# Patient Record
Sex: Male | Born: 1945 | Race: Black or African American | Hispanic: No | Marital: Single | Smoking: Former smoker
Health system: Southern US, Community
[De-identification: ages and names within clinical notes are randomized; demographics above are authoritative.]

## PROBLEM LIST (undated history)

## (undated) DIAGNOSIS — G629 Polyneuropathy, unspecified: Secondary | ICD-10-CM

## (undated) DIAGNOSIS — C189 Malignant neoplasm of colon, unspecified: Secondary | ICD-10-CM

## (undated) DIAGNOSIS — I10 Essential (primary) hypertension: Secondary | ICD-10-CM

## (undated) DIAGNOSIS — C61 Malignant neoplasm of prostate: Secondary | ICD-10-CM

## (undated) DIAGNOSIS — E669 Obesity, unspecified: Secondary | ICD-10-CM

## (undated) DIAGNOSIS — I4891 Unspecified atrial fibrillation: Secondary | ICD-10-CM

## (undated) DIAGNOSIS — C801 Malignant (primary) neoplasm, unspecified: Secondary | ICD-10-CM

## (undated) HISTORY — PX: PROSTATE SURGERY: SHX751

## (undated) HISTORY — PX: COLON SURGERY: SHX602

## (undated) HISTORY — PX: FETAL SURGERY FOR CONGENITAL HERNIA: SHX1618

---

## 2020-04-19 ENCOUNTER — Emergency Department (HOSPITAL_BASED_OUTPATIENT_CLINIC_OR_DEPARTMENT_OTHER)
Admission: EM | Admit: 2020-04-19 | Discharge: 2020-04-19 | Disposition: A | Discharge status: Home / Self Care | Payer: 59 | Attending: Emergency Medicine | Admitting: Emergency Medicine

## 2020-04-19 ENCOUNTER — Other Ambulatory Visit: Payer: Self-pay

## 2020-04-19 ENCOUNTER — Emergency Department (HOSPITAL_BASED_OUTPATIENT_CLINIC_OR_DEPARTMENT_OTHER): Payer: 59

## 2020-04-19 ENCOUNTER — Encounter (HOSPITAL_BASED_OUTPATIENT_CLINIC_OR_DEPARTMENT_OTHER): Payer: Self-pay | Admitting: Emergency Medicine

## 2020-04-19 DIAGNOSIS — Z7901 Long term (current) use of anticoagulants: Secondary | ICD-10-CM | POA: Insufficient documentation

## 2020-04-19 DIAGNOSIS — I110 Hypertensive heart disease with heart failure: Secondary | ICD-10-CM | POA: Diagnosis not present

## 2020-04-19 DIAGNOSIS — I5033 Acute on chronic diastolic (congestive) heart failure: Secondary | ICD-10-CM | POA: Insufficient documentation

## 2020-04-19 DIAGNOSIS — Z87891 Personal history of nicotine dependence: Secondary | ICD-10-CM | POA: Diagnosis not present

## 2020-04-19 DIAGNOSIS — R0602 Shortness of breath: Secondary | ICD-10-CM | POA: Diagnosis present

## 2020-04-19 DIAGNOSIS — I4891 Unspecified atrial fibrillation: Secondary | ICD-10-CM | POA: Diagnosis not present

## 2020-04-19 DIAGNOSIS — Z79899 Other long term (current) drug therapy: Secondary | ICD-10-CM | POA: Diagnosis not present

## 2020-04-19 DIAGNOSIS — I509 Heart failure, unspecified: Secondary | ICD-10-CM

## 2020-04-19 HISTORY — DX: Obesity, unspecified: E66.9

## 2020-04-19 HISTORY — DX: Polyneuropathy, unspecified: G62.9

## 2020-04-19 HISTORY — DX: Essential (primary) hypertension: I10

## 2020-04-19 HISTORY — DX: Malignant neoplasm of prostate: C61

## 2020-04-19 HISTORY — DX: Malignant (primary) neoplasm, unspecified: C80.1

## 2020-04-19 HISTORY — DX: Malignant neoplasm of colon, unspecified: C18.9

## 2020-04-19 HISTORY — DX: Unspecified atrial fibrillation: I48.91

## 2020-04-19 LAB — COMPREHENSIVE METABOLIC PANEL
ALT: 17 U/L (ref 0–44)
AST: 23 U/L (ref 15–41)
Albumin: 3.6 g/dL (ref 3.5–5.0)
Alkaline Phosphatase: 77 U/L (ref 38–126)
Anion gap: 9 (ref 5–15)
BUN: 18 mg/dL (ref 8–23)
CO2: 28 mmol/L (ref 22–32)
Calcium: 9.2 mg/dL (ref 8.9–10.3)
Chloride: 107 mmol/L (ref 98–111)
Creatinine, Ser: 0.83 mg/dL (ref 0.61–1.24)
GFR calc Af Amer: >60 mL/min (ref >60)
GFR calc non Af Amer: >60 mL/min (ref >60)
Glucose, Bld: 147 mg/dL — ABNORMAL HIGH (ref 70–99)
Potassium: 3.5 mmol/L (ref 3.5–5.1)
Sodium: 144 mmol/L (ref 135–145)
Total Bilirubin: 0.8 mg/dL (ref 0.3–1.2)
Total Protein: 7.2 g/dL (ref 6.5–8.1)

## 2020-04-19 LAB — CBC WITH DIFFERENTIAL/PLATELET
Abs Immature Granulocytes: 0.01 10*3/uL (ref 0.00–0.07)
Basophils Absolute: 0 10*3/uL (ref 0.0–0.1)
Basophils Relative: 1 %
Eosinophils Absolute: 0.1 10*3/uL (ref 0.0–0.5)
Eosinophils Relative: 2 %
HCT: 34.2 % — ABNORMAL LOW (ref 39.0–52.0)
Hemoglobin: 11.9 g/dL — ABNORMAL LOW (ref 13.0–17.0)
Immature Granulocytes: 0 %
Lymphocytes Relative: 30 %
Lymphs Abs: 1.3 10*3/uL (ref 0.7–4.0)
MCH: 36 pg — ABNORMAL HIGH (ref 26.0–34.0)
MCHC: 34.8 g/dL (ref 30.0–36.0)
MCV: 103.3 fL — ABNORMAL HIGH (ref 80.0–100.0)
Monocytes Absolute: 0.4 10*3/uL (ref 0.1–1.0)
Monocytes Relative: 9 %
Neutro Abs: 2.5 10*3/uL (ref 1.7–7.7)
Neutrophils Relative %: 58 %
Platelets: 155 10*3/uL (ref 150–400)
RBC: 3.31 MIL/uL — ABNORMAL LOW (ref 4.22–5.81)
RDW: 13.7 % (ref 11.5–15.5)
WBC: 4.3 10*3/uL (ref 4.0–10.5)
nRBC: 0 % (ref 0.0–0.2)

## 2020-04-19 LAB — BRAIN NATRIURETIC PEPTIDE: B Natriuretic Peptide: 176.7 pg/mL — ABNORMAL HIGH (ref 0.0–100.0)

## 2020-04-19 LAB — TROPONIN I (HIGH SENSITIVITY): Troponin I (High Sensitivity): 9 ng/L (ref <18)

## 2020-04-19 MED ORDER — FUROSEMIDE 10 MG/ML IJ SOLN
40.0000 mg | Freq: Once | INTRAMUSCULAR | Status: AC
  Administered 2020-04-19: 40 mg via INTRAVENOUS
  Filled 2020-04-19: qty 4

## 2020-04-19 NOTE — ED Notes (Signed)
Pt on monitor 

## 2020-04-19 NOTE — ED Triage Notes (Signed)
Pt visiting daughter from [place].  Scheduled to fly home tomorrow.  Has been SOB for a year.  Takes fluid med.  Has been getting worse progressively.  Worse for the past couple weeks but came up here anyway.  Sts he is worse today. No cough noted.   Breath sounds clear in triage.

## 2020-04-19 NOTE — Discharge Instructions (Addendum)
Increase your dose of Lasix to 80 mg daily for the next 5 days.  Follow-up with your primary doctor in the next week for a recheck, and return to the ER if you develop severe chest pain, worsening breathing, or other new and concerning symptoms.

## 2020-04-19 NOTE — ED Provider Notes (Addendum)
[facility]PART[facility] Provider Note   CSN: [id number] Arrival date & time: 04/19/20  [id number]     History Chief Complaint  Patient presents with  . Shortness of Breath    Randy Coleman is a 74 y.o. male.  Patient is a 74 year old male with past medical history of persistent A. fib on Eliquis, hypertension, congestive heart failure, peripheral artery disease with stents to both lower extremities.  He presents today for evaluation of shortness of breath.  This has been present for the past year, however has worsened over the past several weeks.  He describes worsening breathing when he lies flat and ambulates.  He also reports increased swelling of his legs and weight gain.  He denies fevers, chills, chest pain, or productive cough.  He has received both doses of the COVID-19 vaccine.  The second dose was given approximately 3 weeks ago.  The history is provided by the patient.  Shortness of Breath      Past Medical History:  Diagnosis Date  . Atrial fibrillation ([place])   . Cancer ([place])   . Colon cancer ([place])   . Hypertension   . Neuropathy   . Obesity   . Prostate cancer ([place])     There are no problems to display for this patient.   Past Surgical History:  Procedure Laterality Date  . COLON SURGERY    . FETAL SURGERY FOR CONGENITAL HERNIA    . PROSTATE SURGERY         No family history on file.  Social History   Tobacco Use  . Smoking status: Former Research scientist (life sciences)  . Smokeless tobacco: Never Used  Substance Use Topics  . Alcohol use: Yes    Comment: occ  . Drug use: Never    Home Medications Prior to Admission medications   Medication Sig Start Date End Date Taking? Authorizing Provider  abacavir-lamiVUDine [facility]) 600-300 MG tablet  04/13/20   [provider]  carvedilol (COREG) 12.5 MG tablet Take 12.5 mg by mouth 2 (two) times daily. 04/13/20   [provider]  cetirizine (ZYRTEC) 10 MG tablet Take 10 mg by mouth daily.  04/13/20   [provider]  diclofenac Sodium (VOLTAREN) 1 % GEL  04/07/20   [provider]  efavirenz (SUSTIVA) 600 MG tablet  04/13/20   [provider]  ELIQUIS 5 MG TABS tablet Take 5 mg by mouth 2 (two) times daily. 04/13/20   [provider]  finasteride (PROSCAR) 5 MG tablet Take 5 mg by mouth daily. 04/13/20   [provider]  furosemide (LASIX) 40 MG tablet Take 40 mg by mouth daily. 04/07/20   [provider]  gabapentin (NEURONTIN) 300 MG capsule  04/13/20   [provider]  hydrALAZINE (APRESOLINE) 50 MG tablet Take 50 mg by mouth 3 (three) times daily. 04/13/20   [provider]  hydrochlorothiazide (HYDRODIURIL) 50 MG tablet Take 50 mg by mouth daily. 04/13/20   [provider]  ibuprofen (ADVIL) 800 MG tablet Take 800 mg by mouth 3 (three) times daily. 04/13/20   [provider]  mometasone (NASONEX) 50 MCG/ACT nasal spray 2 sprays daily. 04/07/20   [provider]  montelukast (SINGULAIR) 10 MG tablet Take 10 mg by mouth daily. 04/13/20   [provider]  NIFEdipine (ADALAT CC) 90 MG 24 hr tablet Take 90 mg by mouth daily. 04/13/20   [provider]  NIFEdipine (PROCARDIA XL/NIFEDICAL-XL) 90 MG 24 hr tablet Take by mouth.  [provider]  Olopatadine HCl 0.2 % SOLN  04/07/20   [provider]  potassium chloride SA (KLOR-CON) 20 MEQ tablet Take by mouth.    [provider]  simvastatin (ZOCOR) 40 MG tablet Take 40 mg by mouth at bedtime. 04/13/20   [provider]  sotalol (BETAPACE) 80 MG tablet Take 80 mg by mouth 2 (two) times daily. 04/13/20   [provider]  telmisartan (MICARDIS) 80 MG tablet Take 80 mg by mouth daily. 04/13/20   [provider]    Allergies    Patient has no known allergies.  Review of Systems   Review of Systems  Respiratory: Positive for shortness of breath.   All other systems reviewed and are  negative.   Physical Exam Updated Vital Signs BP 140/86 (BP Location: Right Arm)   Pulse 77   Temp 98.9 F (37.2 C) (Oral)   Resp 16   Ht 6\' 1"  (1.854 m)   Wt (!) 152.9 kg   SpO2 99%   BMI 44.46 kg/m   Physical Exam Vitals and nursing note reviewed.  Constitutional:      General: He is not in acute distress.    Appearance: He is well-developed. He is not diaphoretic.  HENT:     Head: Normocephalic and atraumatic.  Cardiovascular:     Rate and Rhythm: Normal rate and regular rhythm.     Heart sounds: No murmur. No friction rub.  Pulmonary:     Effort: Pulmonary effort is normal. No respiratory distress.     Breath sounds: Examination of the right-lower field reveals rales. Examination of the left-lower field reveals rales. Rales present. No wheezing.     Comments: There are slight rales in the bases bilaterally. Abdominal:     General: Bowel sounds are normal. There is no distension.     Palpations: Abdomen is soft.     Tenderness: There is no abdominal tenderness.  Musculoskeletal:        General: Normal range of motion.     Cervical back: Normal range of motion and neck supple.     Right lower leg: Edema present.     Left lower leg: Edema present.     Comments: There is 2+ pitting edema of both lower extremities.  Skin:    General: Skin is warm and dry.  Neurological:     Mental Status: He is alert and oriented to person, place, and time.     Coordination: Coordination normal.     ED Results / Procedures / Treatments   Labs (all labs ordered are listed, but only abnormal results are displayed) Labs Reviewed - No data to display  EKG None  Radiology DG Chest 2 View  Result Date: 04/19/2020 CLINICAL DATA:  Short of breath for 1 year. Progressively worsening. EXAM: CHEST - 2 VIEW COMPARISON:  None. FINDINGS: Cardiac silhouette is mildly enlarged. No mediastinal or hilar masses. No evidence of adenopathy. Clear lungs.  No pleural effusion or pneumothorax.  Skeletal structures are intact. IMPRESSION: 1. No acute cardiopulmonary disease. 2. Mild cardiomegaly. Electronically Signed   By: Lajean Manes M.D.   On: 04/19/2020 10:23    Procedures Procedures (including critical care time)  Medications Ordered in ED Medications  furosemide (LASIX) injection 40 mg (has no administration in time range)    ED Course  I have reviewed the triage vital signs and the nursing notes.  Pertinent labs & imaging results that were available during my care of the patient  were reviewed by me and considered in my medical decision making (see chart for details).    MDM Rules/Calculators/A&P  Patient with history of CHF on Lasix and persistent A. fib on Eliquis presenting with complaints of increased leg edema and dyspnea on exertion/orthopnea for the past several weeks.  Patient is here visiting from [place] and is due to fly home tomorrow.  He presented today to be evaluated before traveling.  On exam, patient does have 2+ pitting edema of both lower extremities and rales in the bases on his lung exam.  Work-up shows a mildly elevated BNP with cardiomegaly on his chest x-ray.  I suspect there is a component of CHF that is causing his symptoms.  He was given IV Lasix here in the ER with a good diuresis and is actually feeling somewhat better.  His EKG shows no acute findings and troponin is negative.  All other laboratory studies are essentially unremarkable.  I see no indication for admission at this time.  Patient will be advised to increase his dose of Lasix to 80 mg daily in the morning for the next 5 days and follow-up with his primary doctor when he returns home.  Final Clinical Impression(s) / ED Diagnoses Final diagnoses:  None    Rx / DC Orders ED Discharge Orders    None       Veryl Speak, MD 04/19/20 1226    Veryl Speak, MD 05/10/20 [phone]

## 2020-09-18 IMAGING — CR DG CHEST 2V
2 series · 2 of 2 positions shown · non-contrast
Comparison: None.

CLINICAL DATA: Short of breath for 1 year. Progressively worsening.

EXAM:
CHEST - 2 VIEW

[w chest pa *]
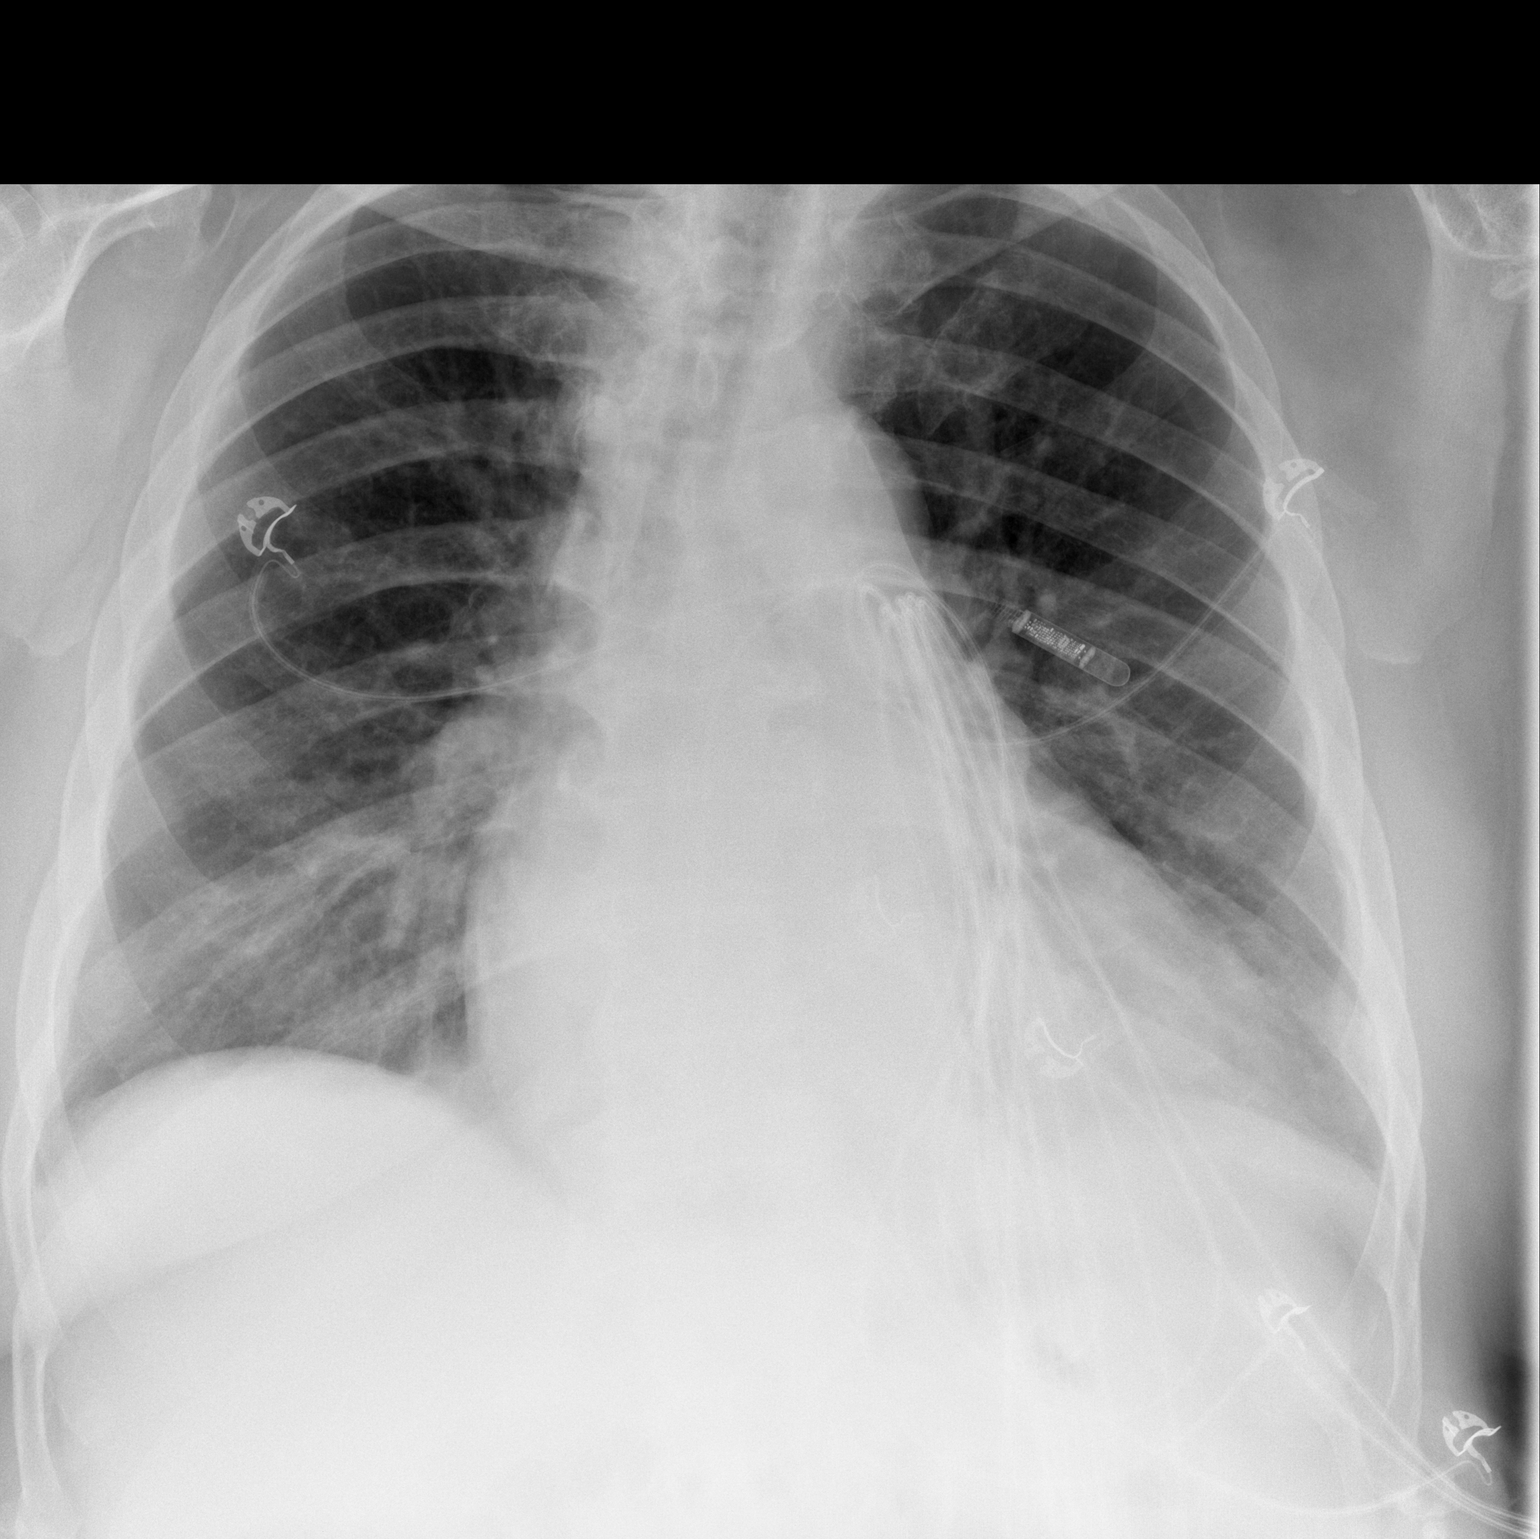

[w chest lat *]
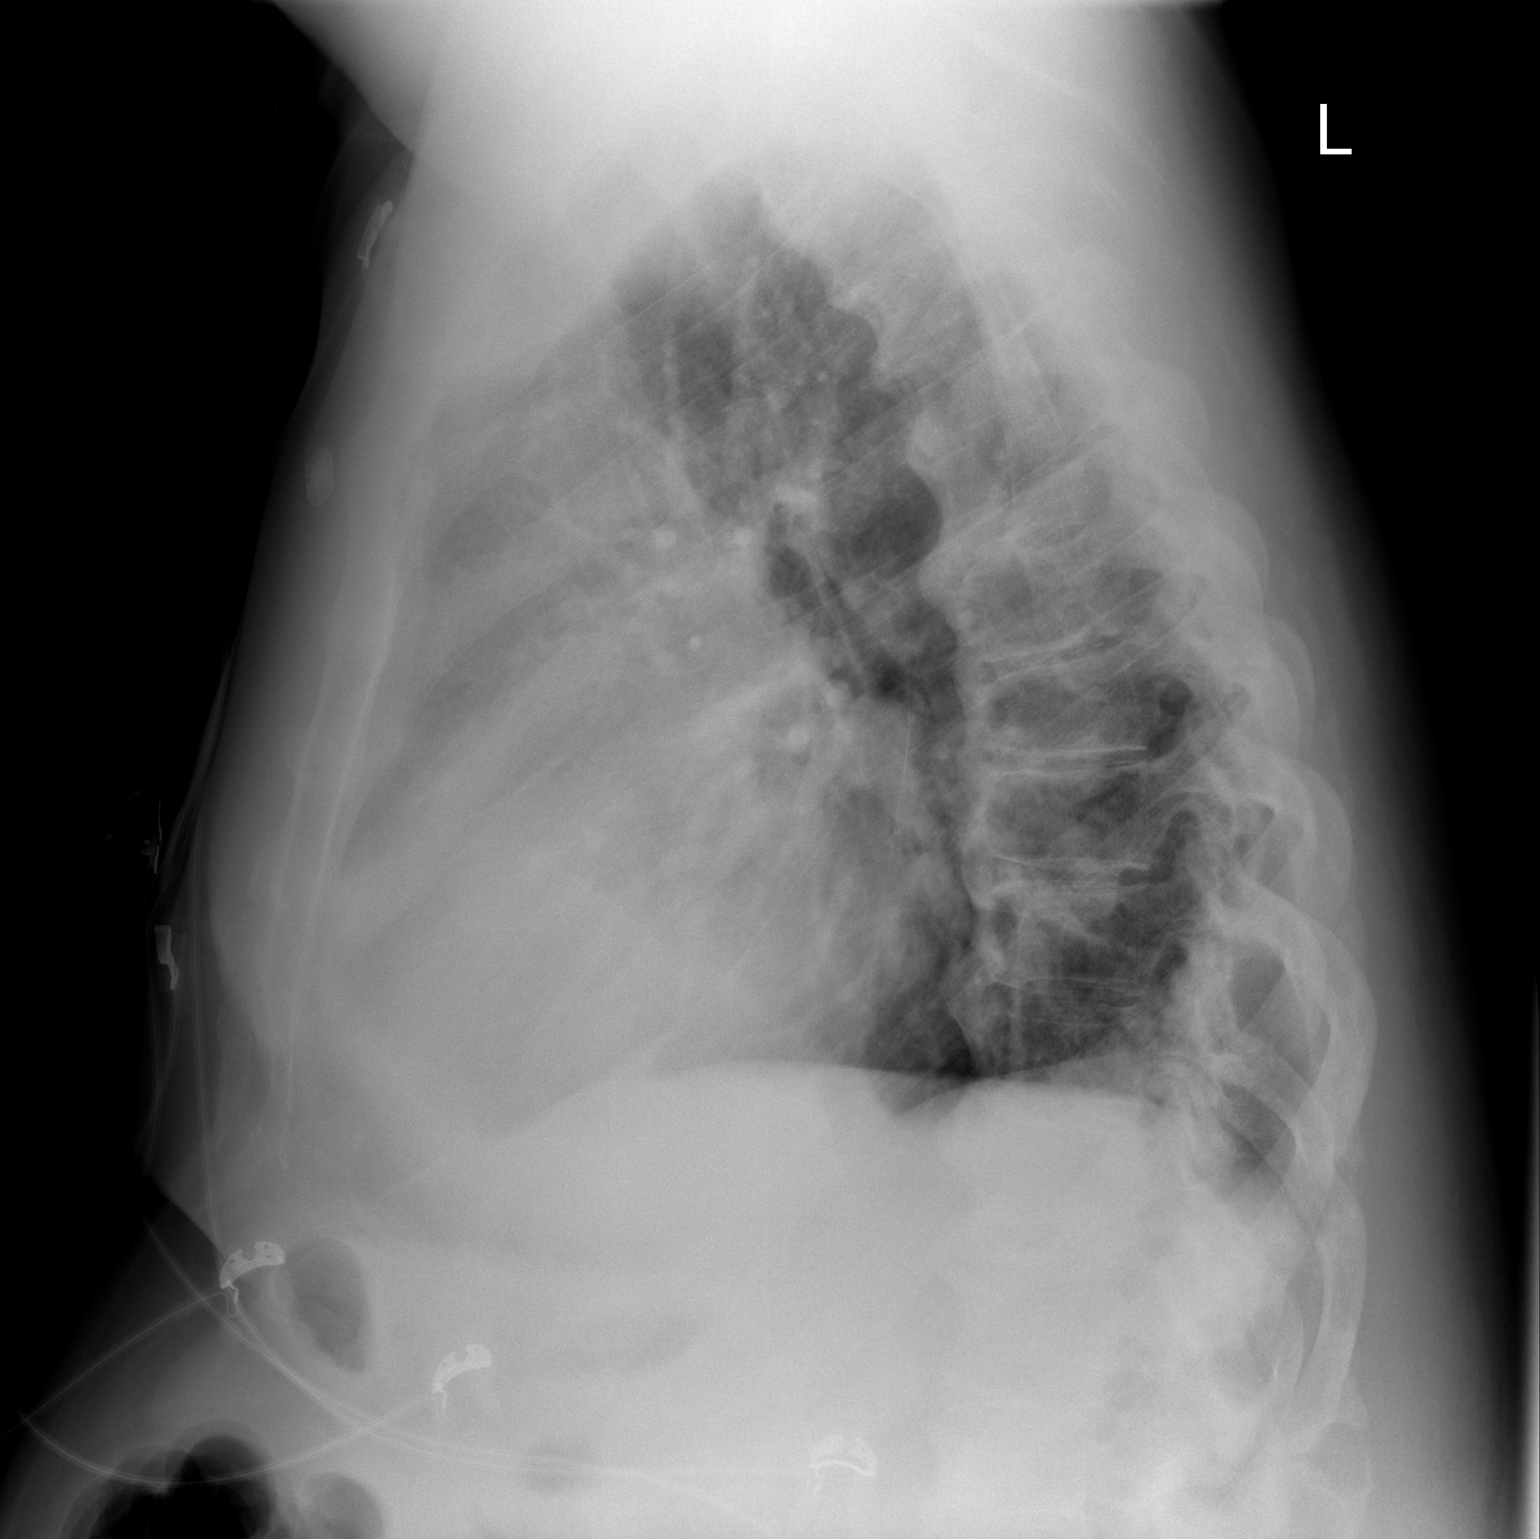

[2 of 2 positions shown; findings below may reference images not displayed]

FINDINGS: Cardiac silhouette is mildly enlarged. No mediastinal or hilar
masses. No evidence of adenopathy.

Clear lungs.  No pleural effusion or pneumothorax.

Skeletal structures are intact.
IMPRESSION: 1. No acute cardiopulmonary disease.
2. Mild cardiomegaly.

## 2023-12-05 DEATH — deceased
